# Patient Record
Sex: Male | Born: 1990 | Race: Black or African American | Hispanic: No | Marital: Single | Smoking: Never smoker
Health system: Southern US, Community
[De-identification: ages and names within clinical notes are randomized; demographics above are authoritative.]

## PROBLEM LIST (undated history)

## (undated) DIAGNOSIS — K5090 Crohn's disease, unspecified, without complications: Secondary | ICD-10-CM

---
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
## 2009-06-15 ENCOUNTER — Ambulatory Visit: Payer: Self-pay | Admitting: Unknown Physician Specialty

## 2010-01-10 ENCOUNTER — Emergency Department: Payer: Self-pay | Admitting: Emergency Medicine

## 2010-02-24 ENCOUNTER — Inpatient Hospital Stay: Payer: Self-pay | Admitting: Psychiatry

## 2011-05-23 ENCOUNTER — Emergency Department: Payer: Self-pay

## 2011-05-23 LAB — URINALYSIS, COMPLETE
Bacteria: NONE SEEN
Blood: NEGATIVE
Glucose,UR: NEGATIVE mg/dL (ref 0–75)
Leukocyte Esterase: NEGATIVE
Protein: NEGATIVE
RBC,UR: 9 /HPF (ref 0–5)
Specific Gravity: 1.026 (ref 1.003–1.030)

## 2011-05-23 LAB — CBC
MCH: 29.8 pg (ref 26.0–34.0)
MCHC: 33.1 g/dL (ref 32.0–36.0)
MCV: 90 fL (ref 80–100)
Platelet: 231 10*3/uL (ref 150–440)
RBC: 5.1 10*6/uL (ref 4.40–5.90)
RDW: 13.2 % (ref 11.5–14.5)
WBC: 7.8 10*3/uL (ref 3.8–10.6)

## 2011-05-23 LAB — COMPREHENSIVE METABOLIC PANEL
Albumin: 4.2 g/dL (ref 3.4–5.0)
Alkaline Phosphatase: 67 U/L (ref 50–136)
Anion Gap: 8 (ref 7–16)
BUN: 9 mg/dL (ref 7–18)
Calcium, Total: 9.1 mg/dL (ref 8.5–10.1)
Chloride: 108 mmol/L — ABNORMAL HIGH (ref 98–107)
Co2: 28 mmol/L (ref 21–32)
Creatinine: 0.87 mg/dL (ref 0.60–1.30)
EGFR (African American): >60
EGFR (Non-African Amer.): >60
Osmolality: 286 (ref 275–301)
Potassium: 4.3 mmol/L (ref 3.5–5.1)
SGPT (ALT): 23 U/L
Total Protein: 8 g/dL (ref 6.4–8.2)

## 2011-05-23 LAB — LIPASE, BLOOD: Lipase: 97 U/L (ref 73–393)

## 2012-06-05 ENCOUNTER — Emergency Department: Payer: Self-pay | Admitting: Unknown Physician Specialty

## 2012-06-05 LAB — URINALYSIS, COMPLETE
Bilirubin,UR: NEGATIVE
Glucose,UR: NEGATIVE mg/dL (ref 0–75)
Ketone: NEGATIVE
Leukocyte Esterase: NEGATIVE
Ph: 5 (ref 4.5–8.0)
Squamous Epithelial: <1
WBC UR: <1 /HPF (ref 0–5)

## 2012-06-05 LAB — COMPREHENSIVE METABOLIC PANEL
Alkaline Phosphatase: 71 U/L (ref 50–136)
Anion Gap: 10 (ref 7–16)
BUN: 11 mg/dL (ref 7–18)
Bilirubin,Total: 0.3 mg/dL (ref 0.2–1.0)
Chloride: 104 mmol/L (ref 98–107)
Co2: 24 mmol/L (ref 21–32)
Creatinine: 0.97 mg/dL (ref 0.60–1.30)
EGFR (African American): >60
Glucose: 163 mg/dL — ABNORMAL HIGH (ref 65–99)
Osmolality: 279 (ref 275–301)
Potassium: 3.7 mmol/L (ref 3.5–5.1)
SGPT (ALT): 27 U/L (ref 12–78)
Sodium: 138 mmol/L (ref 136–145)
Total Protein: 7.3 g/dL (ref 6.4–8.2)

## 2012-06-05 LAB — CBC
HCT: 39.7 % — ABNORMAL LOW (ref 40.0–52.0)
HGB: 13.2 g/dL (ref 13.0–18.0)
MCHC: 33.1 g/dL (ref 32.0–36.0)
MCV: 87 fL (ref 80–100)
Platelet: 256 10*3/uL (ref 150–440)
WBC: 5.1 10*3/uL (ref 3.8–10.6)

## 2012-06-05 LAB — DRUG SCREEN, URINE
Amphetamines, Ur Screen: NEGATIVE (ref ?–1000)
Barbiturates, Ur Screen: NEGATIVE (ref ?–200)
Benzodiazepine, Ur Scrn: NEGATIVE (ref ?–200)
Cannabinoid 50 Ng, Ur ~~LOC~~: NEGATIVE (ref ?–50)
Cocaine Metabolite,Ur ~~LOC~~: NEGATIVE (ref ?–300)
MDMA (Ecstasy)Ur Screen: NEGATIVE (ref ?–500)
Opiate, Ur Screen: NEGATIVE (ref ?–300)
Phencyclidine (PCP) Ur S: NEGATIVE (ref ?–25)
Tricyclic, Ur Screen: NEGATIVE (ref ?–1000)

## 2012-06-05 LAB — ETHANOL: Ethanol: 291 mg/dL

## 2013-06-06 ENCOUNTER — Emergency Department: Payer: Self-pay | Admitting: Emergency Medicine

## 2013-06-11 ENCOUNTER — Emergency Department: Payer: Self-pay | Admitting: Internal Medicine

## 2013-06-23 ENCOUNTER — Emergency Department: Payer: Self-pay | Admitting: Emergency Medicine

## 2013-12-29 ENCOUNTER — Emergency Department: Payer: Self-pay | Admitting: Emergency Medicine

## 2014-06-30 ENCOUNTER — Emergency Department
Admission: EM | Admit: 2014-06-30 | Discharge: 2014-06-30 | Disposition: A | Discharge status: Home / Self Care | Payer: Self-pay | Attending: Emergency Medicine | Admitting: Emergency Medicine

## 2014-06-30 ENCOUNTER — Encounter: Payer: Self-pay | Admitting: *Deleted

## 2014-06-30 DIAGNOSIS — R21 Rash and other nonspecific skin eruption: Secondary | ICD-10-CM | POA: Insufficient documentation

## 2014-06-30 DIAGNOSIS — F419 Anxiety disorder, unspecified: Secondary | ICD-10-CM | POA: Insufficient documentation

## 2014-06-30 HISTORY — DX: Crohn's disease, unspecified, without complications: K50.90

## 2014-06-30 NOTE — ED Provider Notes (Signed)
[facility] Emergency Department Provider Note    ____________________________________________  Time seen: [id number]  I have reviewed the triage vital signs and the nursing notes.   HISTORY  Chief Complaint Rash       HPI Melvin Barnett is a 24 y.o. male who comes in tonight with a rash on his penis. The patient reports that the rash has been there for over a year. The patient reports that he has been seen once in the emergency department and was told that the rash was normal. The patient denies any itching burning or discomfort with the rash. He reports that he just not sure why there and he wants to know what it is. The patient reports that he has been using Gold Bond on his genital area but that has not helped with the rash. The patient reports that he is concerned that he may have HIV although he has no other symptoms. The patient has not had any penile discharge.When asked what made the patient come in for the symptoms tonight he reports that he was just tired of seeing him there. The patient also has 2 areas of discoloration at the base of his penis. There is nothing that makes the rash better or worse.     Past Medical History  Diagnosis Date  . Crohn's disease     There are no active problems to display for this patient.   History reviewed. No pertinent past surgical history.  No current outpatient prescriptions on file.  Allergies Review of patient's allergies indicates no known allergies.  History reviewed. No pertinent family history.  Social History History  Substance Use Topics  . Smoking status: Never Smoker   . Smokeless tobacco: Not on file  . Alcohol Use: Yes    Review of Systems  Constitutional: Negative for fever. Eyes: Negative for visual changes. ENT: Negative for sore throat. Cardiovascular: Negative for chest pain. Respiratory: Negative for shortness of breath. Gastrointestinal: Negative for abdominal pain,  vomiting and diarrhea. Genitourinary: Negative for dysuria. Musculoskeletal: Negative for back pain. Skin: Penile rash. Neurological: Negative for headaches, focal weakness or numbness. Psychiatric:Anxiety   10-point ROS otherwise negative.  ____________________________________________   PHYSICAL EXAM:  VITAL SIGNS: ED Triage Vitals  Enc Vitals Group     BP 06/30/14 0112 156/91 mmHg     Pulse Rate 06/30/14 0112 104     Resp 06/30/14 0112 18     Temp 06/30/14 0112 98.6 F (37 C)     Temp Source 06/30/14 0112 Oral     SpO2 06/30/14 0112 97 %     Weight 06/30/14 0112 140 lb (63.504 kg)     Height 06/30/14 0112 5\' 8"  (1.727 m)     Head Cir --      Peak Flow --      Pain Score --      Pain Loc --      Pain Edu? --      Excl. in GC? --      Constitutional: Alert and oriented. Well appearing and in no distress. Eyes: Conjunctivae are normal. PERRL. Normal extraocular movements. ENT   Head: Normocephalic and atraumatic.   Nose: No congestion/rhinnorhea.   Mouth/Throat: Mucous membranes are moist.   Neck: No stridor. Cardiovascular: Normal rate, regular rhythm. Normal and symmetric distal pulses are present in all extremities. No murmurs, rubs, or gallops. Respiratory: Normal respiratory effort without tachypnea nor retractions. Breath sounds are clear and equal bilaterally. No wheezes/rales/rhonchi. Gastrointestinal: Soft and  nontender. No distention. No abdominal bruits. Genitourinary: The patient has some small papular lesions evenly space at the bottom of his penis and also extending around when the skin is stretched. The patient has an area at the base of his penis of hypopigmentation. He also has an area near the tip of his penis that is also hypopigmented but raised. The patient has no inguinal lymphadenopathy. Musculoskeletal: Nontender with normal range of motion in all extremities. No joint effusions.  No lower extremity tenderness nor edema. Neurologic:   Normal speech and language. No gross focal neurologic deficits are appreciated. Speech is normal. No gait instability. Skin:  Skin is warm, dry and intact. Psychiatric: Mood and affect are normal. Speech and behavior are normal. Patient exhibits appropriate insight and judgment.  ____________________________________________    LABS (pertinent positives/negatives)  None  ____________________________________________   EKG  None  ____________________________________________    RADIOLOGY  None  ____________________________________________   PROCEDURES  Procedure(s) performed: None  Critical Care performed: No  ____________________________________________   INITIAL IMPRESSION / ASSESSMENT AND PLAN / ED COURSE  Pertinent labs & imaging results that were available during my care of the patient were reviewed by me and considered in my medical decision making (see chart for details).  The patient is a 24 year old male who comes in with some papular lesions on his penis. The person who is here with the patient reports that he was told previously that it was normal. Upon examining the patient the bumps that he does notice appears to be follicles on the skin of his penis. They are not affecting the patient at this time. He does not have any itching any burning any discomfort due to this lesion. I attempted to explain this to the patient and he continued to be anxious regarding the lesions. I did explain to the patient that he can always follow up at the health department to receive any STD testing that he may desire. The patient verbalizes agreement and will be discharged home to follow-up at the health department for further STD testing.   ____________________________________________   FINAL CLINICAL IMPRESSION(S) / ED DIAGNOSES  Final diagnoses:  Rash of genital area     Rebecka ApleyAllison P Tabrina Esty, MD 06/30/14 [phone]

## 2014-06-30 NOTE — ED Notes (Signed)
Pt to ED from home c/o rash to penis x1 year.  Pt states little bumps to shaft of penis "that haven't gone away".  Pt states seen by MD before for this and told it was normal.  Pt denies urinary s/s.  Pt A&Ox4, speaking in complete and coherent sentences and in NAD at this time.

## 2014-06-30 NOTE — Discharge Instructions (Signed)

## 2014-06-30 NOTE — ED Notes (Signed)
Pt states that he has bumps, "like a rash" on his penis and testicles for about 1 year. Pt has been using gold bond powder as instructed without relief.

## 2016-04-10 ENCOUNTER — Emergency Department: Payer: Self-pay

## 2016-04-10 ENCOUNTER — Emergency Department
Admission: EM | Admit: 2016-04-10 | Discharge: 2016-04-10 | Disposition: A | Discharge status: Home / Self Care | Payer: Self-pay | Attending: Emergency Medicine | Admitting: Emergency Medicine

## 2016-04-10 ENCOUNTER — Encounter: Payer: Self-pay | Admitting: Emergency Medicine

## 2016-04-10 DIAGNOSIS — K5900 Constipation, unspecified: Secondary | ICD-10-CM | POA: Insufficient documentation

## 2016-04-10 MED ORDER — POLYETHYLENE GLYCOL 3350 17 G PO PACK
17.0000 g | PACK | Freq: Every day | ORAL | 0 refills | Status: AC
Start: 2016-04-10 — End: ?

## 2016-04-10 NOTE — Discharge Instructions (Signed)
Make sure you're drinking plenty of fluids and eating fiber like prunes, raisins or bran cereal. Try the MiraLAX one a day until you have a bowel movement. You can stop the MiraLAX after that. I would like you to see Dr. Mechele CollinElliott since you saw him before. You should also get a regular doctor. You can try [facility] with [facility] or [facility] or [facility] or [facility] or the [facility]. All of these are able to see you for reduced rates.

## 2016-04-10 NOTE — ED Triage Notes (Signed)
Patient ambulatory to triage with steady gait, without difficulty or distress noted; pt reports hx Chron's, no BM in 3 days; denies any N/V or abd pain

## 2016-04-10 NOTE — ED Notes (Signed)
Pt presents to ED with c/o constipation, no bowl movement for about 3 days. Denies nausea/vomiting or pain. Reports Hx of chron's disease since the age of 26 y.o. Denies taking narcotics. States takes prednisone.

## 2016-04-10 NOTE — ED Provider Notes (Signed)
[facility](   [facility] Emergency Department Provider Note   ____________________________________________   First MD Initiated Contact with Patient 04/10/16 0715     (approximate)  I have reviewed the triage vital signs and the nursing notes.   HISTORY  Chief Complaint Constipation    HPI Melvin Barnett is a 26 y.o. male Who reports isease since age 26. His not had any problems recentlyexcept for he has not had a stool for the last 3 days.  he reports a minimal amount of pain suprapubically.   Past Medical History:  Diagnosis Date  . Crohn's disease (HCC)     There are no active problems to display for this patient.   History reviewed. No pertinent surgical history.  Prior to Admission medications   Medication Sig Start Date End Date Taking? Authorizing Provider  polyethylene glycol (MIRALAX) packet Take 17 g by mouth daily. 04/10/16   Arnaldo NatalPaul F Malinda, MD    Allergies Patient has no known allergies.  No family history on file.  Social History Social History  Substance Use Topics  . Smoking status: Never Smoker  . Smokeless tobacco: Never Used  . Alcohol use Yes    Review of Systems Constitutional: No fever/chills Eyes: No visual changes. ENT: No sore throat. Cardiovascular: Denies chest pain. Respiratory: Denies shortness of breath. Gastrointestinal:the history of p Genitourinary: Negative for dysuria. Musculoskeletal: Negative for back pain. Skin: Negative for rash. Neurological: Negative for headaches, focal weakness or numbness.  10-point ROS otherwise negative.  ____________________________________________   PHYSICAL EXAM:  VITAL SIGNS: ED Triage Vitals [04/10/16 0608]  Enc Vitals Group     BP (!) 173/100     Pulse Rate 86     Resp 18     Temp 97.9 F (36.6 C)     Temp Source Oral     SpO2 97 %     Weight 148 lb (67.1 kg)     Height 5\' 8"  (1.727 m)     Head Circumference      Peak Flow      Pain Score      Pain  Loc      Pain Edu?      Excl. in GC?     Constitutional: Alert and oriented. Well appearing and in no acute distress. Eyes: Conjunctivae are normal. PERRL. EOMI. Head: Atraumatic. Nose: No congestion/rhinnorhea. Mouth/Throat: Mucous membranes are moist.  Oropharynx non-erythematous. Neck: No stridor.  Cardiovascular: Normal rate, regular rhythm. Grossly normal heart sounds.  Good peripheral circulation. Respiratory: Normal respiratory effort.  No retractions. Lungs CTAB. Gastrointestinal: Soft and nontenderexcept for minimally to deep palpation suprapubically. No distention. No abdominal bruits. No CVA tenderness. Musculoskeletal: No lower extremity tenderness nor edema.  No joint effusions. Neurologic:  Normal speech and language. No gross focal neurologic deficits are appreciated. No gait instability. Skin:  Skin is warm, dry and intact. No rash noted. Psychiatric: Mood and affect are normal. Speech and behavior are normal.  ____________________________________________   LABS (all labs ordered are listed, but only abnormal results are displayed)  Labs Reviewed - No data to display ____________________________________________  EKG   ____________________________________________  RADIOLOGY  Study Result   CLINICAL DATA:  Abdominal pain.  Constipation .  EXAM: ABDOMEN - 1 VIEW  COMPARISON:  CT 06/05/2012.  FINDINGS: Soft tissue structures are unremarkable. No bowel distention. Stool noted throughout the colon. No pathologic intra- abdominal calcification. No acute bony abnormality .  IMPRESSION: No acute abnormality.   Electronically Signed  ByMaisie Fus  Register   On: 04/10/2016 08:14     ____________________________________________   PROCEDURES  Procedure(s) performed:  Procedures  Critical Care performed:  ____________________________________________   INITIAL IMPRESSION / ASSESSMENT AND PLAN / ED COURSE  Pertinent labs & imaging  results that were available during my care of the patient were reviewed by me and considered in my medical decision making (see chart for details).        ____________________________________________   FINAL CLINICAL IMPRESSION(S) / ED DIAGNOSES  Final diagnoses:  Constipation, unspecified constipation type      NEW MEDICATIONS STARTED DURING THIS VISIT:  New Prescriptions   POLYETHYLENE GLYCOL (MIRALAX) PACKET    Take 17 g by mouth daily.     Note:  This document was prepared using Dragon voice recognition software and may include unintentional dictation errors.    Arnaldo Natal, MD 04/10/16 [phone]

## 2016-08-20 ENCOUNTER — Emergency Department: Payer: Self-pay

## 2016-08-20 ENCOUNTER — Emergency Department
Admission: EM | Admit: 2016-08-20 | Discharge: 2016-08-20 | Disposition: A | Discharge status: Home / Self Care | Payer: Self-pay | Attending: Emergency Medicine | Admitting: Emergency Medicine

## 2016-08-20 DIAGNOSIS — I8001 Phlebitis and thrombophlebitis of superficial vessels of right lower extremity: Secondary | ICD-10-CM | POA: Insufficient documentation

## 2016-08-20 DIAGNOSIS — I8002 Phlebitis and thrombophlebitis of superficial vessels of left lower extremity: Secondary | ICD-10-CM

## 2016-08-20 DIAGNOSIS — M7989 Other specified soft tissue disorders: Secondary | ICD-10-CM

## 2016-08-20 NOTE — ED Triage Notes (Signed)
Patient with left calf swelling. States it is painful to palpation but doesn't affect the way he walks. "Has researched it himself and thinks he has a blood clot." No redness or warmth. Able to walk without alteration in gait.

## 2016-08-20 NOTE — ED Provider Notes (Signed)
[facility] Emergency Department Provider Note   ____________________________________________   First MD Initiated Contact with Patient 08/20/16 [phone]     (approximate)  I have reviewed the triage vital signs and the nursing notes.   HISTORY  Chief Complaint Leg Pain (Knot on left leg, calf swelling)    HPI Melvin Barnett is a 26 y.o. male no medical history except for distant Crohn's. One week ago he slightly rolled his left ankle, but that improved with no ongoing concerns. He noticed today that he had an area behind the left calf is slightly tender. He is able to walk without difficulty, denies any pain pain along the back of the foot or ankle today. No pain or swelling in the thigh or behind the knee. No fevers or chills. No skin redness but reports a skin looks slightly darker in the area that is tender and about the size of his palm at the back of the left calf.  No numbness or weakness. No shortness of breath. No chest pain. No history of any blood clots.   Past Medical History:  Diagnosis Date  . Crohn's disease (HCC)     There are no active problems to display for this patient.   History reviewed. No pertinent surgical history.  Prior to Admission medications   Medication Sig Start Date End Date Taking? Authorizing Provider  polyethylene glycol (MIRALAX) packet Take 17 g by mouth daily. 04/10/16   Arnaldo NatalMalinda, Paul F, MD    Allergies Patient has no known allergies.  No family history on file.  Social History Social History  Substance Use Topics  . Smoking status: Never Smoker  . Smokeless tobacco: Never Used  . Alcohol use Yes    Review of Systems Constitutional: No fever/chills Eyes: No visual changes. ENT: No sore throat. Cardiovascular: Denies chest pain. Respiratory: Denies shortness of breath. Gastrointestinal: No abdominal pain.  No nausea, no vomiting.  No diarrhea.  No constipation. Genitourinary: Negative for  dysuria. Musculoskeletal: Negative for back pain. Skin: Negative for rash. Neurological: Negative for headaches, focal weakness or numbness.    ____________________________________________   PHYSICAL EXAM:  VITAL SIGNS: ED Triage Vitals [08/20/16 0144]  Enc Vitals Group     BP (!) 132/91     Pulse Rate 72     Resp 18     Temp 98.8 F (37.1 C)     Temp Source Oral     SpO2 97 %     Weight 146 lb (66.2 kg)     Height 5\' 8"  (1.727 m)     Head Circumference      Peak Flow      Pain Score 7     Pain Loc      Pain Edu?      Excl. in GC?     Constitutional: Alert and oriented. Well appearing and in no acute distress. Eyes: Conjunctivae are normal. Head: Atraumatic. Nose: No congestion/rhinnorhea. Mouth/Throat: Mucous membranes are moist. Neck: No stridor.   Cardiovascular: Normal rate, regular rhythm. Grossly normal heart sounds.  Good peripheral circulation. Respiratory: Normal respiratory effort.  No retractions. Lungs CTAB. Gastrointestinal: Soft and nontender. No distention. Musculoskeletal: Lower Extremities  No edema. Normal DP/PT pulses bilateral with good cap refill.  Normal neuro-motor function lower extremities bilateral.  RIGHT Right lower extremity demonstrates normal strength, good use of all muscles. No edema bruising or contusions of the right hip, right knee, right ankle. Full range of motion of the right lower extremity  without pain. No pain on axial loading. No evidence of trauma.  LEFT Left lower extremity demonstrates normal strength, good use of all muscles. No edema bruising or contusions of the hip,  knee, ankle. Full range of motion of the left lower extremity without pain. No pain on axial loading. No evidence of trauma. There is mild fullness of the skin behind the left calf, with normal plantar and dorsiflexion and no tenderness over the Achilles. There is a slight darkening of the skin in this region that is about the size of the palm over the  back of the left calf but no erythema, no induration, no fluctuance or mass. No venous cords are noted. There is no tenderness to palpation over the popliteal fossa or along the thigh.  Neurologic:  Normal speech and language. No gross focal neurologic deficits are appreciated.  Skin:  Skin is warm, dry and intact. No rash noted. Psychiatric: Mood and affect are normal. Speech and behavior are normal.  ____________________________________________   LABS (all labs ordered are listed, but only abnormal results are displayed)  Labs Reviewed - No data to display ____________________________________________  EKG   ____________________________________________  RADIOLOGY  US Venous Img Lower Unilateral Left  Result Date: 08/20/2016 CLINICAL DATA:  Left calf medial pain and edema EXAM: Left LOWER EXTREMITY VENOUS DOPPLER ULTRASOUND TECHNIQUE: Gray-scale sonography with graded compression, as well as color Doppler and duplex ultrasound were performed to evaluate the lower extremity deep venous systems from the level of the common femoral vein and including the common femoral, femoral, profunda femoral, popliteal and calf veins including the posterior tibial, peroneal and gastrocnemius veins when visible. The superficial great saphenous vein was also interrogated. Spectral Doppler was utilized to evaluate flow at rest and with distal augmentation maneuvers in the common femoral, femoral and popliteal veins. COMPARISON:  None. FINDINGS: Contralateral Common Femoral Vein: Respiratory phasicity is normal and symmetric with the symptomatic side. No evidence of thrombus. Normal compressibility. Common Femoral Vein: No evidence of thrombus. Normal compressibility, respiratory phasicity and response to augmentation. Saphenofemoral Junction: No evidence of thrombus. Normal compressibility and flow on color Doppler imaging. Profunda Femoral Vein: No evidence of thrombus. Normal compressibility and flow on color  Doppler imaging. Femoral Vein: No evidence of thrombus. Normal compressibility, respiratory phasicity and response to augmentation. Popliteal Vein: No evidence of thrombus. Normal compressibility, respiratory phasicity and response to augmentation. Calf Veins: No evidence of thrombus. Normal compressibility and flow on color Doppler imaging. Other Findings: Partially thrombosed superficial vein within the medial mid calf in the region of pain IMPRESSION: No evidence of DVT within the left lower extremity. Suspect superficial thrombophlebitis within the medial mid calf where the patient reports pain. Electronically Signed   By: Jasmine Pang M.D.   On: 08/20/2016 03:33   No thrombus is noted near the saphenofemoral junction or within any deep venous structures. ____________________________________________   PROCEDURES  Procedure(s) performed: None  Procedures  Critical Care performed: No  ____________________________________________   INITIAL IMPRESSION / ASSESSMENT AND PLAN / ED COURSE  Pertinent labs & imaging results that were available during my care of the patient were reviewed by me and considered in my medical decision making (see chart for details).  Patient presents for tenderness along the left calf posteriorly. He has an area with slight skin darkening and minimal tenderness without erythema or induration. He reports he rolled his ankle about a week ago, and that pain and swelling went away but then this started to occur. Neurovascularly intact.  No evidence of trauma. No fevers or chills, no signs of infection.  Appears consistent with superficial thrombophlebitis that does not involve any deep venous segments.  We'll treat conservatively, discuss careful return precautions and treatment recommendations with the patient who is agreeable.      ____________________________________________   FINAL CLINICAL IMPRESSION(S) / ED DIAGNOSES  Final diagnoses:  Thrombophlebitis of  superficial veins of left lower extremity      NEW MEDICATIONS STARTED DURING THIS VISIT:  New Prescriptions   No medications on file     Note:  This document was prepared using Dragon voice recognition software and may include unintentional dictation errors.     Sharyn Creamer, MD 08/20/16 [phone]

## 2016-08-20 NOTE — Discharge Instructions (Signed)
You may return to the [facility] walk-[facility] or the emergency room at any time should you have concerns that the swelling is worsening, he began experiencing a fever, starts seeing a lot of redness in the skin or signs of infection, or you notice increased swelling or advancement of the area of swelling into the knee or thigh.  Return to the emergency room right away if he develop shortness of breath, fever, a large amount or increased swelling, significant pain, weakness in the foot or other concerns arise.

## 2016-08-20 NOTE — ED Notes (Signed)
pt given verbal instructions for discharge by this RN and Dr Fanny BienQuale; pt to lobby att will return for DC paperwork

## 2016-08-20 NOTE — ED Notes (Signed)
Pt didn't return for DC paperwork

## 2019-02-23 ENCOUNTER — Emergency Department: Payer: Self-pay

## 2019-02-23 ENCOUNTER — Other Ambulatory Visit: Payer: Self-pay

## 2019-02-23 ENCOUNTER — Emergency Department
Admission: EM | Admit: 2019-02-23 | Discharge: 2019-02-23 | Disposition: A | Discharge status: Home / Self Care | Payer: Self-pay | Attending: Emergency Medicine | Admitting: Emergency Medicine

## 2019-02-23 ENCOUNTER — Encounter: Payer: Self-pay | Admitting: Emergency Medicine

## 2019-02-23 DIAGNOSIS — R0789 Other chest pain: Secondary | ICD-10-CM | POA: Insufficient documentation

## 2019-02-23 DIAGNOSIS — R05 Cough: Secondary | ICD-10-CM | POA: Insufficient documentation

## 2019-02-23 DIAGNOSIS — Z20822 Contact with and (suspected) exposure to covid-19: Secondary | ICD-10-CM

## 2019-02-23 DIAGNOSIS — Z79899 Other long term (current) drug therapy: Secondary | ICD-10-CM | POA: Insufficient documentation

## 2019-02-23 DIAGNOSIS — M791 Myalgia, unspecified site: Secondary | ICD-10-CM | POA: Insufficient documentation

## 2019-02-23 DIAGNOSIS — Z20828 Contact with and (suspected) exposure to other viral communicable diseases: Secondary | ICD-10-CM | POA: Insufficient documentation

## 2019-02-23 LAB — CBC
HCT: 42 % (ref 39.0–52.0)
Hemoglobin: 14 g/dL (ref 13.0–17.0)
MCH: 31 pg (ref 26.0–34.0)
MCHC: 33.3 g/dL (ref 30.0–36.0)
MCV: 93.1 fL (ref 80.0–100.0)
Platelets: 182 10*3/uL (ref 150–400)
RBC: 4.51 MIL/uL (ref 4.22–5.81)
RDW: 13.6 % (ref 11.5–15.5)
WBC: 5 10*3/uL (ref 4.0–10.5)
nRBC: 0 % (ref 0.0–0.2)

## 2019-02-23 LAB — BASIC METABOLIC PANEL
Anion gap: 12 (ref 5–15)
BUN: 17 mg/dL (ref 6–20)
CO2: 24 mmol/L (ref 22–32)
Calcium: 8.4 mg/dL — ABNORMAL LOW (ref 8.9–10.3)
Chloride: 97 mmol/L — ABNORMAL LOW (ref 98–111)
Creatinine, Ser: 0.77 mg/dL (ref 0.61–1.24)
GFR calc Af Amer: >60 mL/min (ref >60)
GFR calc non Af Amer: >60 mL/min (ref >60)
Glucose, Bld: 122 mg/dL — ABNORMAL HIGH (ref 70–99)
Potassium: 3.5 mmol/L (ref 3.5–5.1)
Sodium: 133 mmol/L — ABNORMAL LOW (ref 135–145)

## 2019-02-23 LAB — TROPONIN I (HIGH SENSITIVITY)
Troponin I (High Sensitivity): 8 ng/L (ref <18)
Troponin I (High Sensitivity): 4 ng/L (ref <18)

## 2019-02-23 LAB — POC SARS CORONAVIRUS 2 AG: SARS Coronavirus 2 Ag: NEGATIVE

## 2019-02-23 MED ORDER — KETOROLAC TROMETHAMINE 30 MG/ML IJ SOLN
30.0000 mg | Freq: Once | INTRAMUSCULAR | Status: AC
  Administered 2019-02-23: 30 mg via INTRAMUSCULAR
  Filled 2019-02-23: qty 1

## 2019-02-23 NOTE — ED Triage Notes (Signed)
Pt reports awoke this am with bodyaches, shaking and a pain in his chest that is tight in nature. Pt denies fevers but reports does have a cough.

## 2019-02-23 NOTE — ED Provider Notes (Signed)
[facility] Emergency Department Provider Note   ____________________________________________    I have reviewed the triage vital signs and the nursing notes.   HISTORY  Chief Complaint Chest Pain, Cough, and Generalized Body Aches     HPI Melvin Barnett is a 28 y.o. male who presents with complaints of cough chest discomfort, generalized body aches.  Patient reports he woke up with the symptoms today.  He is not sure if he may have been exposed to someone with novel coronavirus.  Denies shortness of breath.  No pleurisy.  No nausea or vomiting.  Unclear on fevers, possible chills.  Past Medical History:  Diagnosis Date  . Crohn's disease (HCC)     There are no problems to display for this patient.   History reviewed. No pertinent surgical history.  Prior to Admission medications   Medication Sig Start Date End Date Taking? Authorizing Provider  polyethylene glycol (MIRALAX) packet Take 17 g by mouth daily. 04/10/16   Arnaldo Natal, MD     Allergies Patient has no known allergies.  No family history on file.  Social History Social History   Tobacco Use  . Smoking status: Never Smoker  . Smokeless tobacco: Never Used  Substance Use Topics  . Alcohol use: Yes  . Drug use: No    Review of Systems  Constitutional: No fever/chills Eyes: No visual changes.  ENT: No sore throat. Cardiovascular: As above Respiratory: As Gastrointestinal: No abdominal pain.  No nausea, no vomiting.   Genitourinary: Negative for dysuria. Musculoskeletal: Negative for back pain. Skin: Negative for rash. Neurological: Negative for headaches   ____________________________________________   PHYSICAL EXAM:  VITAL SIGNS: ED Triage Vitals  Enc Vitals Group     BP 02/23/19 0842 (!) 174/107     Pulse Rate 02/23/19 0842 95     Resp 02/23/19 0842 18     Temp 02/23/19 0842 98.2 F (36.8 C)     Temp Source 02/23/19 0842 Oral     SpO2 02/23/19  0842 99 %     Weight 02/23/19 0810 68 kg (150 lb)     Height 02/23/19 0810 1.727 m (5\' 8" )     Head Circumference --      Peak Flow --      Pain Score 02/23/19 0810 5     Pain Loc --      Pain Edu? --      Excl. in GC? --     Constitutional: Alert and oriented.   Nose: No congestion/rhinnorhea. Mouth/Throat: Mucous membranes are moist.    Cardiovascular: Normal rate, regular rhythm. Grossly normal heart sounds.  Good peripheral circulation. Respiratory: Normal respiratory effort.  No retractions. Lungs CTAB. Gastrointestinal: Soft and nontender. No distention.    Musculoskeletal:  Warm and well perfused Neurologic:  Normal speech and language. No gross focal neurologic deficits are appreciated.  Skin:  Skin is warm, dry and intact. No rash noted. Psychiatric: Mood and affect are normal. Speech and behavior are normal.  ____________________________________________   LABS (all labs ordered are listed, but only abnormal results are displayed)  Labs Reviewed  BASIC METABOLIC PANEL - Abnormal; Notable for the following components:      Result Value   Sodium 133 (*)    Chloride 97 (*)    Glucose, Bld 122 (*)    Calcium 8.4 (*)    All other components within normal limits  NOVEL CORONAVIRUS, NAA (HOSP ORDER, SEND-OUT TO REF LAB; TAT 18-24 HRS)  CBC  POC SARS CORONAVIRUS 2 AG  POC SARS CORONAVIRUS 2 AG -  ED  TROPONIN I (HIGH SENSITIVITY)  TROPONIN I (HIGH SENSITIVITY)   ____________________________________________  EKG  ED ECG REPORT I, Lavonia Drafts, the attending physician, personally viewed and interpreted this ECG.  Date: 02/23/2019  Rhythm: normal sinus rhythm QRS Axis: normal Intervals: normal ST/T Wave abnormalities: normal Narrative Interpretation: no evidence of acute ischemia  ____________________________________________  RADIOLOGY  Chest x-ray unremarkable ____________________________________________   PROCEDURES  Procedure(s) performed: No   Procedures   Critical Care performed: No ____________________________________________   INITIAL IMPRESSION / ASSESSMENT AND PLAN / ED COURSE  Pertinent labs & imaging results that were available during my care of the patient were reviewed by me and considered in my medical decision making (see chart for details).  Patient well-appearing and in no acute distress, symptoms are concerning for possible early COVID-19.  Chest x-ray is reassuring, lab work is unremarkable.  Second troponin performed improved.  Not consistent with ACS, not consistent with PE or dissection.  EKG is reassuring.  Rapid swab is negative, PCR test sent, Conty precautions recommended    ____________________________________________   FINAL CLINICAL IMPRESSION(S) / ED DIAGNOSES  Final diagnoses:  Suspected COVID-19 virus infection        Note:  This document was prepared using Dragon voice recognition software and may include unintentional dictation errors.   Lavonia Drafts, MD 02/23/19 1538

## 2019-02-23 NOTE — ED Triage Notes (Signed)
Pt reports also feels dizzy and when his chest gets tight he feels like he cannot breathe.

## 2019-02-24 LAB — NOVEL CORONAVIRUS, NAA (HOSP ORDER, SEND-OUT TO REF LAB; TAT 18-24 HRS): SARS-CoV-2, NAA: NOT DETECTED

## 2019-10-01 ENCOUNTER — Emergency Department
Admission: EM | Admit: 2019-10-01 | Discharge: 2019-10-01 | Disposition: A | Discharge status: Home / Self Care | Payer: BLUE CROSS/BLUE SHIELD | Attending: Emergency Medicine | Admitting: Emergency Medicine

## 2019-10-01 ENCOUNTER — Encounter: Payer: Self-pay | Admitting: Emergency Medicine

## 2019-10-01 ENCOUNTER — Other Ambulatory Visit: Payer: Self-pay

## 2019-10-01 DIAGNOSIS — Y9389 Activity, other specified: Secondary | ICD-10-CM | POA: Insufficient documentation

## 2019-10-01 DIAGNOSIS — X500XXA Overexertion from strenuous movement or load, initial encounter: Secondary | ICD-10-CM | POA: Diagnosis not present

## 2019-10-01 DIAGNOSIS — Y999 Unspecified external cause status: Secondary | ICD-10-CM | POA: Insufficient documentation

## 2019-10-01 DIAGNOSIS — S39012A Strain of muscle, fascia and tendon of lower back, initial encounter: Secondary | ICD-10-CM | POA: Diagnosis not present

## 2019-10-01 DIAGNOSIS — Y9289 Other specified places as the place of occurrence of the external cause: Secondary | ICD-10-CM | POA: Diagnosis not present

## 2019-10-01 DIAGNOSIS — S3992XA Unspecified injury of lower back, initial encounter: Secondary | ICD-10-CM | POA: Diagnosis present

## 2019-10-01 MED ORDER — CYCLOBENZAPRINE HCL 5 MG PO TABS: 5.0000 mg | ORAL_TABLET | Freq: Three times a day (TID) | ORAL | 0 refills | Status: DC | PRN

## 2019-10-01 NOTE — ED Provider Notes (Signed)
[facility] Emergency Department Provider Note ____________________________________________  Time seen: 1126  I have reviewed the triage vital signs and the nursing notes.  HISTORY  Chief Complaint  Back Pain  HPI Melvin Barnett is a 29 y.o. male presents her self to the ED for evaluation of bilateral lumbar muscle strain and stiffness.  Patient describes onset  yesterday, but notes the direct cause may been lifting activities at work performed 2 days prior.  He denies any fall, slip, trip.  He denies any bladder/bowel incontinence, foot drop, or saddle anesthesia.  He also denies any history of chronic or ongoing low back pain.  Patient has taken a dose of Tylenol in the interim with limited benefit.  Past Medical History:  Diagnosis Date  . Crohn's disease (HCC)     There are no problems to display for this patient.   History reviewed. No pertinent surgical history.  Prior to Admission medications   Medication Sig Start Date End Date Taking? Authorizing Provider  cyclobenzaprine (FLEXERIL) 5 MG tablet Take 1 tablet (5 mg total) by mouth 3 (three) times daily as needed. 10/01/19   Tonia Avino, Charlesetta Ivory, PA-C  polyethylene glycol (MIRALAX) packet Take 17 g by mouth daily. 04/10/16   Arnaldo Natal, MD    Allergies Patient has no known allergies.  History reviewed. No pertinent family history.  Social History Social History   Tobacco Use  . Smoking status: Never Smoker  . Smokeless tobacco: Never Used  Vaping Use  . Vaping Use: Never used  Substance Use Topics  . Alcohol use: Yes  . Drug use: No    Review of Systems  Constitutional: Negative for fever. Cardiovascular: Negative for chest pain. Respiratory: Negative for shortness of breath. Gastrointestinal: Negative for abdominal pain, vomiting and diarrhea. Genitourinary: Negative for dysuria. Musculoskeletal: Positive for back pain. Skin: Negative for rash. Neurological: Negative for  headaches, focal weakness or numbness. ____________________________________________  PHYSICAL EXAM:  VITAL SIGNS: ED Triage Vitals  Enc Vitals Group     BP 10/01/19 0631 (!) 156/102     Pulse Rate 10/01/19 0631 77     Resp 10/01/19 0631 17     Temp 10/01/19 0631 98.8 F (37.1 C)     Temp Source 10/01/19 0631 Oral     SpO2 10/01/19 0631 97 %     Weight 10/01/19 0632 150 lb (68 kg)     Height 10/01/19 0632 5\' 8"  (1.727 m)     Head Circumference --      Peak Flow --      Pain Score 10/01/19 0635 8     Pain Loc --      Pain Edu? --      Excl. in GC? --     Constitutional: Alert and oriented. Well appearing and in no distress. Head: Normocephalic and atraumatic. Eyes: Conjunctivae are normal. Normal extraocular movements Cardiovascular: Normal rate, regular rhythm. Normal distal pulses. Respiratory: Normal respiratory effort. No wheezes/rales/rhonchi. Gastrointestinal: Soft and nontender. No distention. Musculoskeletal: Normal spinal, spasm, deformity, or step-off.  Patient transitions from sit to stand without assistance.  Normal lumbar extension and self-limited lumbar flexion range on exam.  Nontender with normal range of motion in all extremities.  Neurologic: Cranial nerves II through XII grossly intact.  Normal LE DTRs bilaterally.  Negative seated straight leg raise bilaterally.  Normal toe and heel raise on exam.  Negative Trendelenburg.  Normal gait without ataxia. Normal speech and language. No gross focal neurologic deficits are  appreciated. Skin:  Skin is warm, dry and intact. No rash noted. Psychiatric: Mood and affect are normal. Patient exhibits appropriate insight and judgment. ____________________________________________  PROCEDURES   Procedures ____________________________________________  INITIAL IMPRESSION / ASSESSMENT AND PLAN / ED COURSE  Patient with ED evaluation of bilateral lumbar sacral muscle pain.  Exam is overall benign reassuring at this time.   No signs of acute neuromuscular deficit and no red flags.  Patient without a history of trauma or chronic back pain with reproducible pain on exam.  Will be treated with anti-inflammatories and muscle relaxants at this time.  Work note is provided for 2 days as requested.  He is referred to a local community clinic for ongoing symptoms.  Melvin Barnett was evaluated in Emergency Department on 10/01/2019 for the symptoms described in the history of present illness. He was evaluated in the context of the global COVID-19 pandemic, which necessitated consideration that the patient might be at risk for infection with the SARS-CoV-2 virus that causes COVID-19. Institutional protocols and algorithms that pertain to the evaluation of patients at risk for COVID-19 are in a state of rapid change based on information released by regulatory bodies including the CDC and federal and state organizations. These policies and algorithms were followed during the patient's care in the ED. ____________________________________________  FINAL CLINICAL IMPRESSION(S) / ED DIAGNOSES  Final diagnoses:  Strain of lumbar region, initial encounter      Lissa Hoard, PA-C 10/01/19 Kathaleen Maser, MD 10/03/19 0002

## 2019-10-01 NOTE — ED Triage Notes (Signed)
Patient ambulatory to triage with steady gait, without difficulty or distress noted; pt reports lower back pain radiating into buttocks since yesterday; denies any specific injury; st pain increases with movement or bending over

## 2019-10-01 NOTE — Discharge Instructions (Addendum)
Your exam is consistent with muscle strain. Take OTC Ibuprofen for pain and inflammation. Take the prescription muscle relaxant as needed. Apply ice and/or moist heat to reduce symptoms.

## 2020-10-14 ENCOUNTER — Other Ambulatory Visit: Payer: Self-pay

## 2020-10-14 ENCOUNTER — Emergency Department
Admission: EM | Admit: 2020-10-14 | Discharge: 2020-10-14 | Disposition: A | Discharge status: Home / Self Care | Payer: No Typology Code available for payment source | Attending: Emergency Medicine | Admitting: Emergency Medicine

## 2020-10-14 ENCOUNTER — Emergency Department: Payer: No Typology Code available for payment source

## 2020-10-14 DIAGNOSIS — M791 Myalgia, unspecified site: Secondary | ICD-10-CM | POA: Insufficient documentation

## 2020-10-14 DIAGNOSIS — M7918 Myalgia, other site: Secondary | ICD-10-CM

## 2020-10-14 DIAGNOSIS — M25561 Pain in right knee: Secondary | ICD-10-CM | POA: Diagnosis present

## 2020-10-14 DIAGNOSIS — M25562 Pain in left knee: Secondary | ICD-10-CM | POA: Diagnosis not present

## 2020-10-14 DIAGNOSIS — Y92410 Unspecified street and highway as the place of occurrence of the external cause: Secondary | ICD-10-CM | POA: Insufficient documentation

## 2020-10-14 MED ORDER — NAPROXEN 500 MG PO TABS: 500.0000 mg | ORAL_TABLET | Freq: Two times a day (BID) | ORAL | Status: AC

## 2020-10-14 MED ORDER — ORPHENADRINE CITRATE ER 100 MG PO TB12: 100.0000 mg | ORAL_TABLET | Freq: Two times a day (BID) | ORAL | 0 refills | Status: AC
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
## 2020-10-14 NOTE — Discharge Instructions (Addendum)
No acute findings x-ray of the bilateral knee.  Read and follow discharge care instruction.  Take medication as needed.
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
